# Patient Record
Sex: Female | Born: 1940
Health system: Southern US, Community
[De-identification: ages and names within clinical notes are randomized; demographics above are authoritative.]

---

## 1999-07-25 ENCOUNTER — Encounter: Admission: RE | Admit: 1999-07-25 | Discharge: 1999-07-25 | Payer: Self-pay

## 2009-10-11 ENCOUNTER — Encounter: Admission: RE | Admit: 2009-10-11 | Discharge: 2009-10-11 | Payer: Self-pay | Admitting: Orthopedic Surgery

## 2010-03-07 ENCOUNTER — Ambulatory Visit (HOSPITAL_COMMUNITY)
Admission: RE | Admit: 2010-03-07 | Discharge: 2010-03-08 | Payer: Self-pay | Source: Home / Self Care | Admitting: Orthopedic Surgery

## 2010-04-23 ENCOUNTER — Inpatient Hospital Stay (HOSPITAL_COMMUNITY)
Admission: RE | Admit: 2010-04-23 | Discharge: 2010-04-25 | Payer: Self-pay | Source: Home / Self Care | Attending: Orthopedic Surgery | Admitting: Orthopedic Surgery

## 2010-07-07 LAB — DIFFERENTIAL
Lymphocytes Relative: 30 % (ref 12–46)
Lymphs Abs: 2.5 10*3/uL (ref 0.7–4.0)
Monocytes Relative: 5 % (ref 3–12)
Neutro Abs: 4.6 10*3/uL (ref 1.7–7.7)
Neutrophils Relative %: 55 % (ref 43–77)

## 2010-07-07 LAB — BASIC METABOLIC PANEL
BUN: 10 mg/dL (ref 6–23)
BUN: 11 mg/dL (ref 6–23)
CO2: 26 mEq/L (ref 19–32)
CO2: 27 mEq/L (ref 19–32)
CO2: 28 mEq/L (ref 19–32)
Calcium: 10.9 mg/dL — ABNORMAL HIGH (ref 8.4–10.5)
Calcium: 9 mg/dL (ref 8.4–10.5)
Calcium: 9.2 mg/dL (ref 8.4–10.5)
Chloride: 106 mEq/L (ref 96–112)
Creatinine, Ser: 0.73 mg/dL (ref 0.4–1.2)
Creatinine, Ser: 0.76 mg/dL (ref 0.4–1.2)
GFR calc Af Amer: >60 mL/min (ref >60)
GFR calc Af Amer: >60 mL/min (ref >60)
GFR calc non Af Amer: >60 mL/min (ref >60)
Glucose, Bld: 107 mg/dL — ABNORMAL HIGH (ref 70–99)
Glucose, Bld: 144 mg/dL — ABNORMAL HIGH (ref 70–99)
Glucose, Bld: 88 mg/dL (ref 70–99)
Potassium: 4.5 mEq/L (ref 3.5–5.1)
Sodium: 137 mEq/L (ref 135–145)
Sodium: 141 mEq/L (ref 135–145)

## 2010-07-07 LAB — URINALYSIS, ROUTINE W REFLEX MICROSCOPIC
Bilirubin Urine: NEGATIVE
Glucose, UA: NEGATIVE mg/dL
Hgb urine dipstick: NEGATIVE
Ketones, ur: NEGATIVE mg/dL
Nitrite: NEGATIVE
Protein, ur: NEGATIVE mg/dL
Specific Gravity, Urine: 1.009 (ref 1.005–1.030)
Urobilinogen, UA: 0.2 mg/dL (ref 0.0–1.0)
pH: 7 (ref 5.0–8.0)

## 2010-07-07 LAB — CBC
HCT: 43.5 % (ref 36.0–46.0)
Hemoglobin: 14.2 g/dL (ref 12.0–15.0)
Hemoglobin: 9.4 g/dL — ABNORMAL LOW (ref 12.0–15.0)
MCH: 29 pg (ref 26.0–34.0)
MCH: 29.2 pg (ref 26.0–34.0)
MCH: 29.6 pg (ref 26.0–34.0)
MCHC: 31.3 g/dL (ref 30.0–36.0)
MCHC: 32.1 g/dL (ref 30.0–36.0)
MCHC: 32.6 g/dL (ref 30.0–36.0)
MCV: 90.8 fL (ref 78.0–100.0)
Platelets: 211 10*3/uL (ref 150–400)
Platelets: 257 10*3/uL (ref 150–400)
RBC: 2.97 MIL/uL — ABNORMAL LOW (ref 3.87–5.11)
RBC: 4.79 MIL/uL (ref 3.87–5.11)
RDW: 13.9 % (ref 11.5–15.5)
RDW: 14 % (ref 11.5–15.5)
WBC: 8.4 10*3/uL (ref 4.0–10.5)

## 2010-07-07 LAB — ABO/RH: ABO/RH(D): B POS

## 2010-07-07 LAB — SURGICAL PCR SCREEN
MRSA, PCR: NEGATIVE
Staphylococcus aureus: NEGATIVE

## 2010-07-07 LAB — PROTIME-INR
INR: 1 (ref 0.00–1.49)
Prothrombin Time: 13.4 seconds (ref 11.6–15.2)

## 2010-07-07 LAB — TYPE AND SCREEN: ABO/RH(D): B POS

## 2010-07-07 LAB — APTT: aPTT: 33 seconds (ref 24–37)

## 2010-07-09 LAB — URINALYSIS, ROUTINE W REFLEX MICROSCOPIC
Ketones, ur: NEGATIVE mg/dL
Nitrite: NEGATIVE
Protein, ur: NEGATIVE mg/dL

## 2010-07-09 LAB — CBC
HCT: 35 % — ABNORMAL LOW (ref 36.0–46.0)
HCT: 40.1 % (ref 36.0–46.0)
Hemoglobin: 12 g/dL (ref 12.0–15.0)
Hemoglobin: 13.8 g/dL (ref 12.0–15.0)
MCH: 30.1 pg (ref 26.0–34.0)
MCHC: 34.5 g/dL (ref 30.0–36.0)
MCV: 87.8 fL (ref 78.0–100.0)
RBC: 3.99 MIL/uL (ref 3.87–5.11)
RDW: 14 % (ref 11.5–15.5)
WBC: 9.3 10*3/uL (ref 4.0–10.5)

## 2010-07-09 LAB — BASIC METABOLIC PANEL
BUN: 8 mg/dL (ref 6–23)
CO2: 25 mEq/L (ref 19–32)
Chloride: 107 mEq/L (ref 96–112)
Glucose, Bld: 98 mg/dL (ref 70–99)
Potassium: 4.3 mEq/L (ref 3.5–5.1)
Potassium: 4.9 mEq/L (ref 3.5–5.1)
Sodium: 139 mEq/L (ref 135–145)
Sodium: 141 mEq/L (ref 135–145)

## 2010-07-09 LAB — DIFFERENTIAL
Basophils Relative: 1 % (ref 0–1)
Eosinophils Absolute: 0.5 10*3/uL (ref 0.0–0.7)
Monocytes Absolute: 0.6 10*3/uL (ref 0.1–1.0)
Monocytes Relative: 7 % (ref 3–12)
Neutro Abs: 4.9 10*3/uL (ref 1.7–7.7)

## 2010-07-09 LAB — APTT: aPTT: 34 seconds (ref 24–37)

## 2010-07-27 IMAGING — CT CT EXTREM LOW W/O CM*R*
2 of 3 series · 12 of 36 positions shown, 19 images · non-contrast
Comparison: None.

CLINICAL DATA: Right leg pain.  Fractured femur on 02/08/2009.
Open reduction and internal fixation.

CT OF THE RIGHT KNEE WITHOUT CONTRAST
TECHNIQUE: Multidetector CT imaging of the right knee was
performed according to the standard protocol without intravenous
contrast. Multiplanar CT image reconstructions were also generated.

[Series 3: pelvis standard · axial · 0.35mm/px · z∈[-234,-74]mm · 11 of 74 slices shown, 17 images]
[im 5/74  soft-tissue]
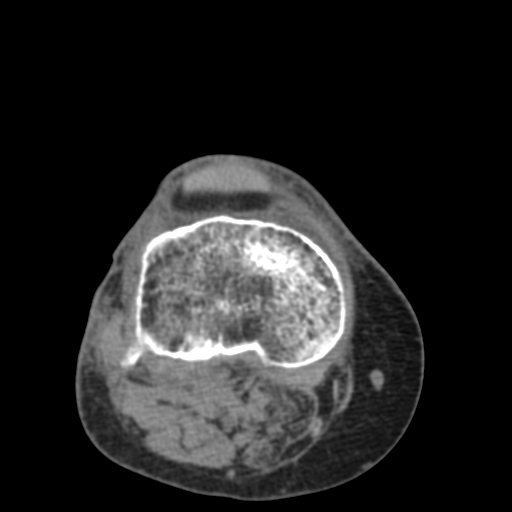
[im 5/74  bone]
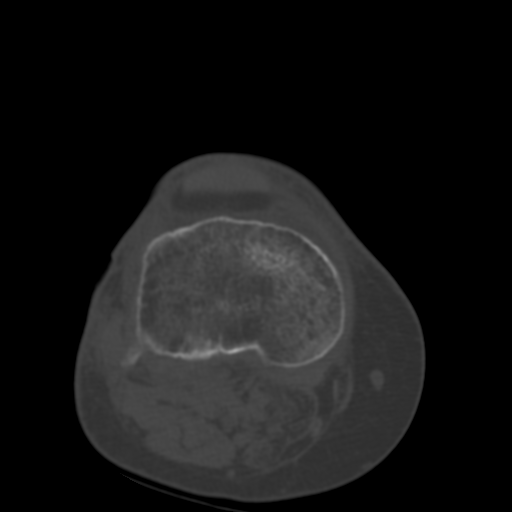
[im 9/74  soft-tissue]
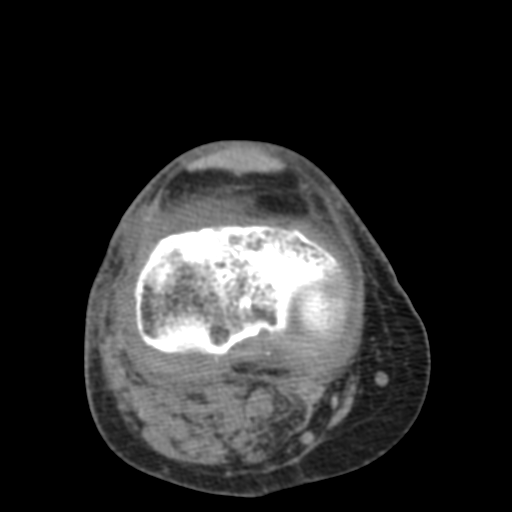
[im 13/74  soft-tissue]
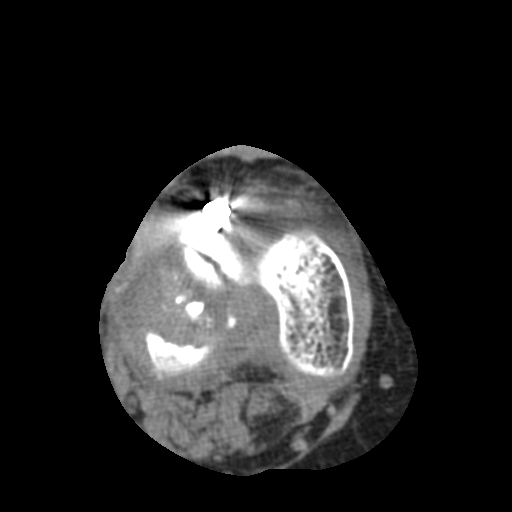
[im 18/74  soft-tissue]
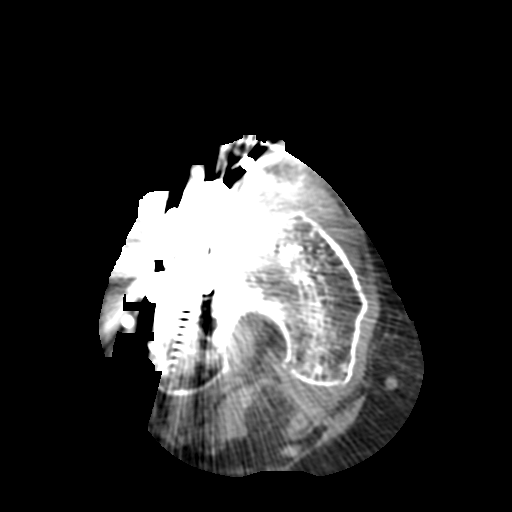
[im 26/74  soft-tissue]
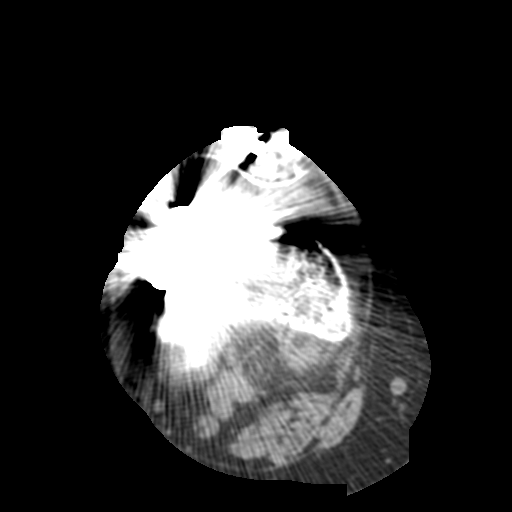
[im 39/74  soft-tissue]
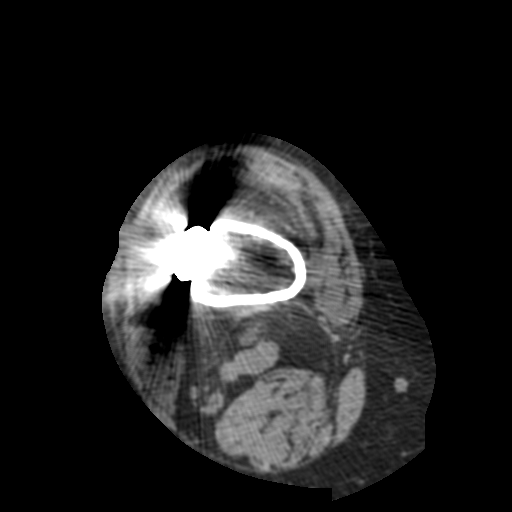
[im 48/74  soft-tissue]
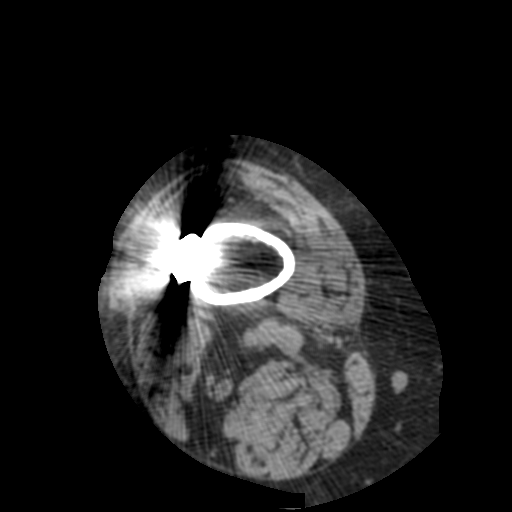
[im 52/74  soft-tissue]
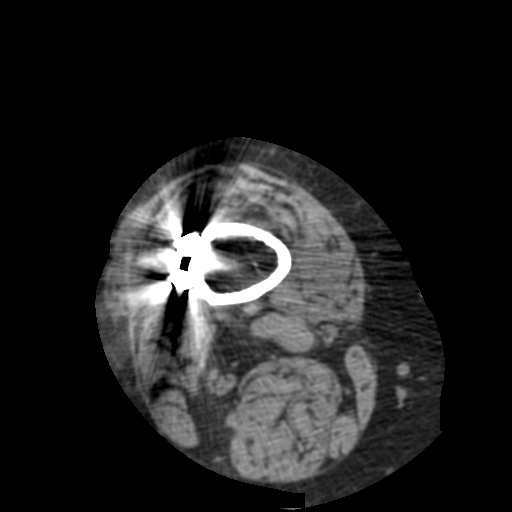
[im 52/74  lung]
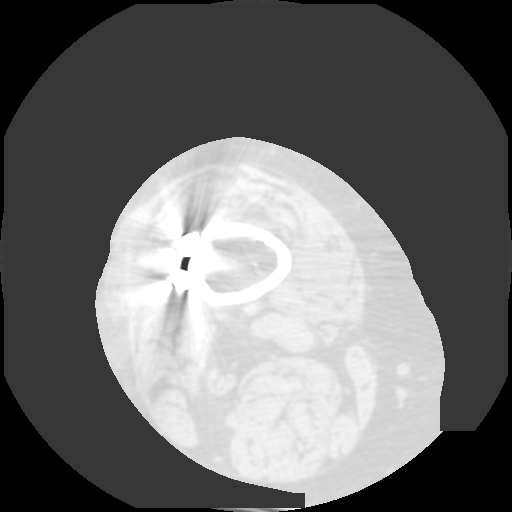
[im 56/74  soft-tissue]
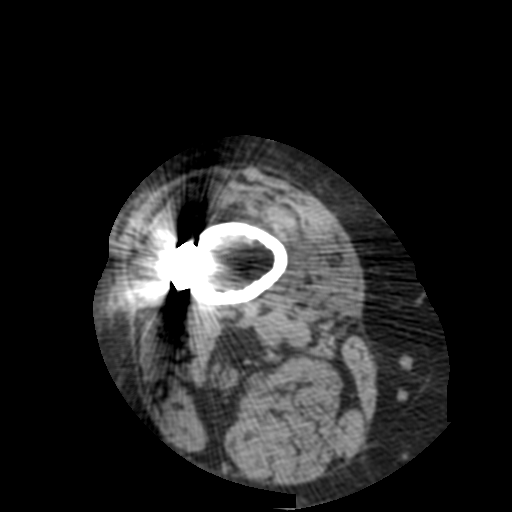
[im 56/74  lung]
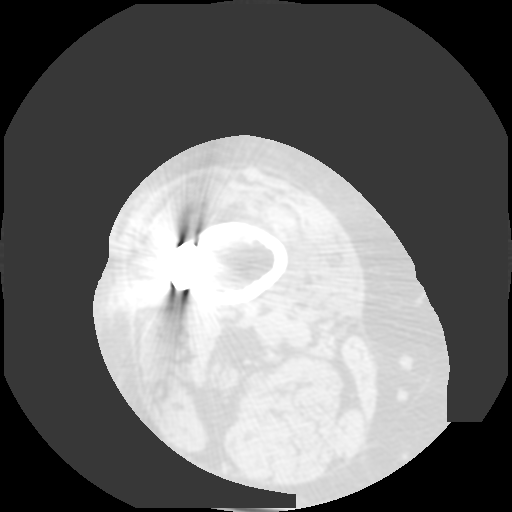
[im 56/74  bone]
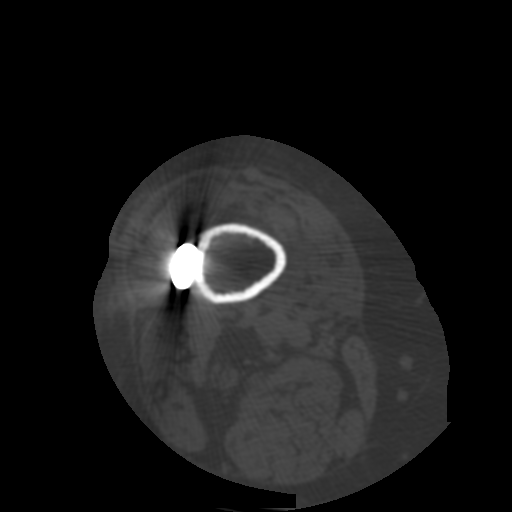
[im 65/74  soft-tissue]
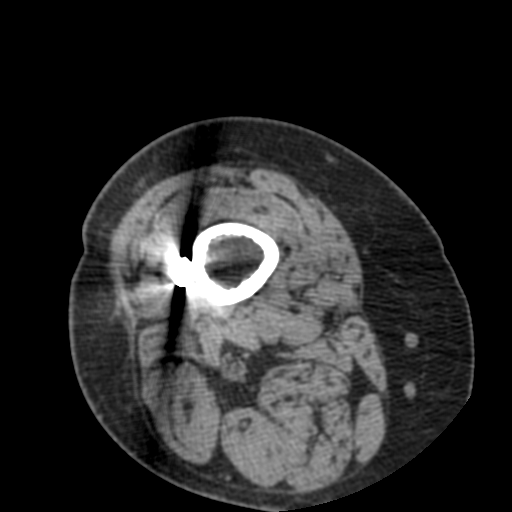
[im 65/74  lung]
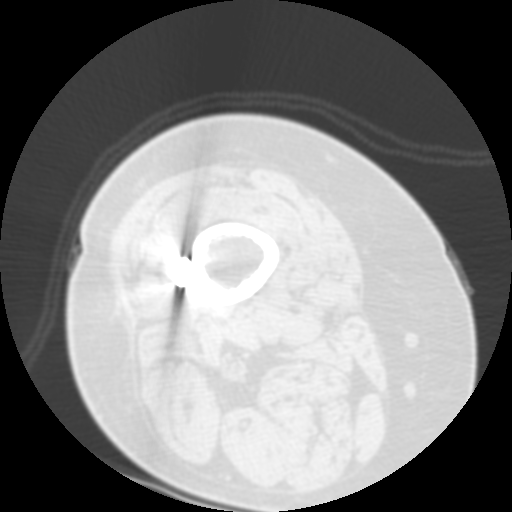
[im 69/74  soft-tissue]
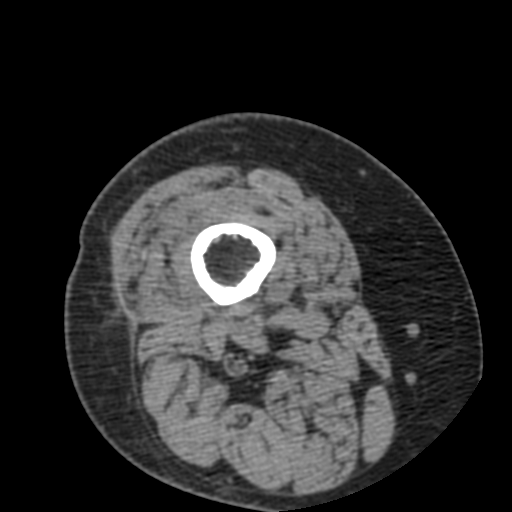
[im 69/74  lung]
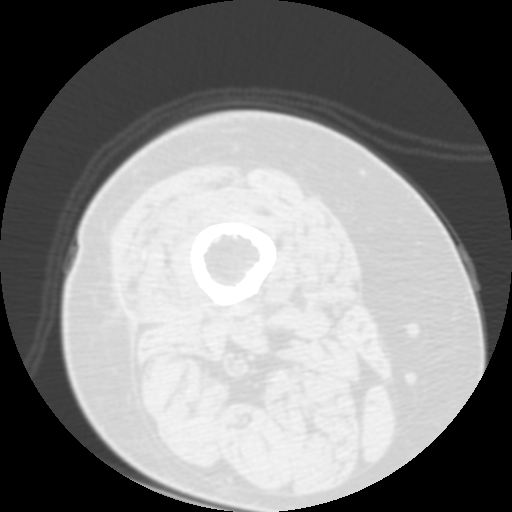

[Series 401: sagittal · sagittal · 0.37mm/px · 1 of 54 slices shown, 2 images]
[im 18/54  soft-tissue]
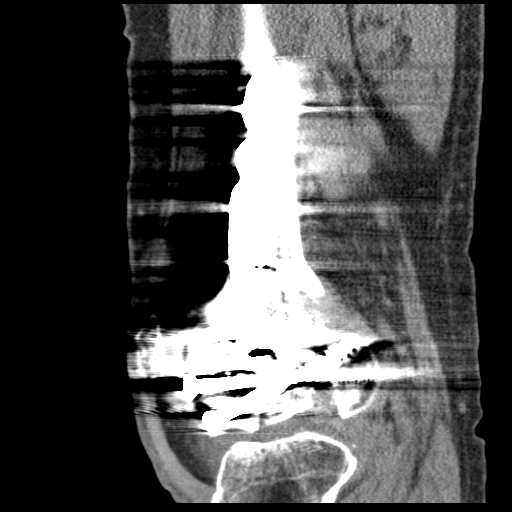
[im 18/54  bone]
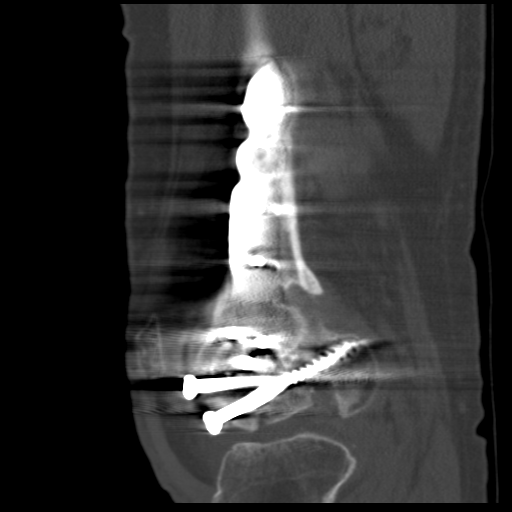

[12 of 36 positions shown; findings below may reference images not displayed]

FINDINGS: There is a comminuted nonunion fracture of the lateral
femoral condyle.  There is fragmentation and impaction of the
central fragments of the lateral femoral condyle.

The medial femoral condyle and proximal tibia are intact.  There is
degenerative narrowing of the medial compartment.

There are no discrete findings of loosening of the screws or side
plate.
IMPRESSION: 1.  Nonunion comminuted fractures of the lateral femoral condyle.
2.  Fragments of the central portion of the lateral femoral condyle
are impacted and fragmented.

## 2015-05-08 DIAGNOSIS — L723 Sebaceous cyst: Secondary | ICD-10-CM | POA: Diagnosis not present

## 2015-05-08 DIAGNOSIS — Z6827 Body mass index (BMI) 27.0-27.9, adult: Secondary | ICD-10-CM | POA: Diagnosis not present

## 2015-05-09 DIAGNOSIS — L02212 Cutaneous abscess of back [any part, except buttock]: Secondary | ICD-10-CM | POA: Diagnosis not present

## 2015-05-09 DIAGNOSIS — L723 Sebaceous cyst: Secondary | ICD-10-CM | POA: Diagnosis not present

## 2015-05-09 DIAGNOSIS — Z6826 Body mass index (BMI) 26.0-26.9, adult: Secondary | ICD-10-CM | POA: Diagnosis not present

## 2015-10-24 DIAGNOSIS — Z6826 Body mass index (BMI) 26.0-26.9, adult: Secondary | ICD-10-CM | POA: Diagnosis not present

## 2015-10-24 DIAGNOSIS — R7303 Prediabetes: Secondary | ICD-10-CM | POA: Diagnosis not present

## 2015-10-24 DIAGNOSIS — E039 Hypothyroidism, unspecified: Secondary | ICD-10-CM | POA: Diagnosis not present

## 2015-10-24 DIAGNOSIS — Z139 Encounter for screening, unspecified: Secondary | ICD-10-CM | POA: Diagnosis not present

## 2015-10-24 DIAGNOSIS — E782 Mixed hyperlipidemia: Secondary | ICD-10-CM | POA: Diagnosis not present

## 2015-10-24 DIAGNOSIS — E663 Overweight: Secondary | ICD-10-CM | POA: Diagnosis not present

## 2016-04-23 DIAGNOSIS — E039 Hypothyroidism, unspecified: Secondary | ICD-10-CM | POA: Diagnosis not present

## 2016-04-23 DIAGNOSIS — E782 Mixed hyperlipidemia: Secondary | ICD-10-CM | POA: Diagnosis not present

## 2016-04-23 DIAGNOSIS — Z6826 Body mass index (BMI) 26.0-26.9, adult: Secondary | ICD-10-CM | POA: Diagnosis not present

## 2016-06-12 DIAGNOSIS — Z6827 Body mass index (BMI) 27.0-27.9, adult: Secondary | ICD-10-CM | POA: Diagnosis not present

## 2016-06-12 DIAGNOSIS — Z0000 Encounter for general adult medical examination without abnormal findings: Secondary | ICD-10-CM | POA: Diagnosis not present

## 2016-06-12 DIAGNOSIS — E663 Overweight: Secondary | ICD-10-CM | POA: Diagnosis not present

## 2016-12-08 DIAGNOSIS — M8589 Other specified disorders of bone density and structure, multiple sites: Secondary | ICD-10-CM | POA: Diagnosis not present

## 2016-12-08 DIAGNOSIS — E782 Mixed hyperlipidemia: Secondary | ICD-10-CM | POA: Diagnosis not present

## 2016-12-08 DIAGNOSIS — E559 Vitamin D deficiency, unspecified: Secondary | ICD-10-CM | POA: Diagnosis not present

## 2016-12-08 DIAGNOSIS — Z6827 Body mass index (BMI) 27.0-27.9, adult: Secondary | ICD-10-CM | POA: Diagnosis not present

## 2016-12-08 DIAGNOSIS — E8881 Metabolic syndrome: Secondary | ICD-10-CM | POA: Diagnosis not present

## 2016-12-08 DIAGNOSIS — Z79899 Other long term (current) drug therapy: Secondary | ICD-10-CM | POA: Diagnosis not present

## 2016-12-08 DIAGNOSIS — K219 Gastro-esophageal reflux disease without esophagitis: Secondary | ICD-10-CM | POA: Diagnosis not present

## 2016-12-08 DIAGNOSIS — E039 Hypothyroidism, unspecified: Secondary | ICD-10-CM | POA: Diagnosis not present

## 2016-12-08 DIAGNOSIS — Z1211 Encounter for screening for malignant neoplasm of colon: Secondary | ICD-10-CM | POA: Diagnosis not present

## 2017-03-10 DIAGNOSIS — E782 Mixed hyperlipidemia: Secondary | ICD-10-CM | POA: Diagnosis not present

## 2017-03-10 DIAGNOSIS — K219 Gastro-esophageal reflux disease without esophagitis: Secondary | ICD-10-CM | POA: Diagnosis not present

## 2017-03-10 DIAGNOSIS — E034 Atrophy of thyroid (acquired): Secondary | ICD-10-CM | POA: Diagnosis not present

## 2017-05-20 DIAGNOSIS — E039 Hypothyroidism, unspecified: Secondary | ICD-10-CM | POA: Insufficient documentation

## 2017-05-20 DIAGNOSIS — E785 Hyperlipidemia, unspecified: Secondary | ICD-10-CM | POA: Insufficient documentation

## 2017-05-20 DIAGNOSIS — Z0000 Encounter for general adult medical examination without abnormal findings: Secondary | ICD-10-CM | POA: Insufficient documentation

## 2018-03-04 DIAGNOSIS — I10 Essential (primary) hypertension: Secondary | ICD-10-CM | POA: Insufficient documentation

## 2018-03-04 DIAGNOSIS — E669 Obesity, unspecified: Secondary | ICD-10-CM | POA: Insufficient documentation

## 2018-11-30 ENCOUNTER — Other Ambulatory Visit: Payer: Self-pay | Admitting: Sports Medicine

## 2018-11-30 ENCOUNTER — Encounter: Payer: Self-pay | Admitting: Sports Medicine

## 2018-11-30 ENCOUNTER — Ambulatory Visit (INDEPENDENT_AMBULATORY_CARE_PROVIDER_SITE_OTHER): Payer: Medicare Other | Admitting: Sports Medicine

## 2018-11-30 ENCOUNTER — Other Ambulatory Visit: Payer: Self-pay

## 2018-11-30 VITALS — Temp 98.7°F | Resp 16

## 2018-11-30 DIAGNOSIS — M79671 Pain in right foot: Secondary | ICD-10-CM

## 2018-11-30 DIAGNOSIS — M2042 Other hammer toe(s) (acquired), left foot: Secondary | ICD-10-CM

## 2018-11-30 DIAGNOSIS — M2041 Other hammer toe(s) (acquired), right foot: Secondary | ICD-10-CM

## 2018-11-30 DIAGNOSIS — M792 Neuralgia and neuritis, unspecified: Secondary | ICD-10-CM | POA: Diagnosis not present

## 2018-11-30 DIAGNOSIS — L84 Corns and callosities: Secondary | ICD-10-CM | POA: Diagnosis not present

## 2018-11-30 DIAGNOSIS — M79672 Pain in left foot: Secondary | ICD-10-CM

## 2018-11-30 MED ORDER — GABAPENTIN 300 MG PO CAPS: 300.0000 mg | ORAL_CAPSULE | Freq: Every day | ORAL | 3 refills | Status: AC

## 2018-11-30 NOTE — Progress Notes (Signed)
Subjective: Theresa Perkins is a 78 y.o. female patient who presents to office for evaluation of Left>Right foot pain secondary to callus skin at toes. Patient complains of pain at the lesion present in between the toes 9/10 shooting pain with burning across the tops of both feet that has been getting worse of the years. Patient has tried corn pads and tape with no relief in symptoms. Patient denies any other pedal complaints.   Review of Systems  All other systems reviewed and are negative.    There are no active problems to display for this patient.   No current outpatient medications on file prior to visit.   No current facility-administered medications on file prior to visit.     Allergies  Allergen Reactions  . Tape Rash    Objective:  General: Alert and oriented x3 in no acute distress  Dermatology: Keratotic lesion present lateral 4th toes L>R and medial 5th toes with at interspace with skin lines transversing the lesions, pain is present with direct pressure to the lesion with a central nucleated core noted, no webspace macerations, no ecchymosis bilateral, all nails x 10 are well manicured.  Vascular: Dorsalis Pedis and Posterior Tibial pedal pulses 2/4, Capillary Fill Time 3 seconds, + pedal hair growth bilateral, no edema bilateral lower extremities, Temperature gradient within normal limits.  Neurology: Gross sensation intact via light touch bilateral.++Buring pain to both feet, diffusely.   Musculoskeletal: Mild tenderness with palpation at the keratotic lesion site on Left>Right, Muscular strength 5/5 in all groups without pain or limitation on range of motion. +Hammertoe boney deformity noted.  Assessment and Plan: Problem List Items Addressed This Visit    None    Visit Diagnoses    Neuritis    -  Primary   Corns and callosities       Hammer toes of both feet       Foot pain, bilateral         -Complete examination performed -Discussed treatment  options -Parred keratoic lesion using a chisel blade x 2 at no charge -Dispensed toe spacers -Advised good supportive shoes and inserts -Rx Gabapentin for burning pain -Patient to return to office in 1 month for med check or sooner if condition worsens. Advised patient if continues to be painful may benefit from hammertoe surgery. If she decides that she wants surgery will need to get Xrays.   Landis Martins, DPM

## 2018-12-15 ENCOUNTER — Ambulatory Visit: Payer: Medicare Other | Admitting: Sports Medicine

## 2018-12-15 ENCOUNTER — Encounter: Payer: Self-pay | Admitting: Sports Medicine

## 2018-12-15 ENCOUNTER — Other Ambulatory Visit: Payer: Self-pay

## 2018-12-15 VITALS — Temp 99.0°F | Resp 16

## 2018-12-15 DIAGNOSIS — M2042 Other hammer toe(s) (acquired), left foot: Secondary | ICD-10-CM

## 2018-12-15 DIAGNOSIS — M2041 Other hammer toe(s) (acquired), right foot: Secondary | ICD-10-CM

## 2018-12-15 DIAGNOSIS — M79671 Pain in right foot: Secondary | ICD-10-CM

## 2018-12-15 DIAGNOSIS — L84 Corns and callosities: Secondary | ICD-10-CM | POA: Diagnosis not present

## 2018-12-15 DIAGNOSIS — M792 Neuralgia and neuritis, unspecified: Secondary | ICD-10-CM | POA: Diagnosis not present

## 2018-12-15 DIAGNOSIS — M79672 Pain in left foot: Secondary | ICD-10-CM

## 2018-12-15 NOTE — Progress Notes (Signed)
Subjective: Theresa Perkins is a 78 y.o. female patient who returns to office for follow-up evaluation of bilateral fifth toe pain left greater than right.  Patient reports that the gabapentin has helped the burning pain does not burn is much but reports that she still has pain at the baby toes especially when in shoes reports that the position helps a little bit but pain on average is 5 out of 10.  Patient denies any other changes or any other constitutional symptoms at this time.   There are no active problems to display for this patient.   Current Outpatient Medications on File Prior to Visit  Medication Sig Dispense Refill  . gabapentin (NEURONTIN) 300 MG capsule Take 1 capsule (300 mg total) by mouth at bedtime. 90 capsule 3  . hydrochlorothiazide (HYDRODIURIL) 25 MG tablet     . levothyroxine (SYNTHROID) 50 MCG tablet     . lisinopril (ZESTRIL) 5 MG tablet     . pravastatin (PRAVACHOL) 80 MG tablet      No current facility-administered medications on file prior to visit.     Allergies  Allergen Reactions  . Tape Rash    Objective:  General: Alert and oriented x3 in no acute distress  Dermatology: Keratotic lesion present lateral 4th toes L>R and medial 5th toes with at interspace with skin lines transversing the lesions, pain is present with direct pressure to the lesion with a central nucleated core noted like before, no webspace macerations, no ecchymosis bilateral, all nails x 10 are well manicured.  Vascular: Dorsalis Pedis and Posterior Tibial pedal pulses 2/4, Capillary Fill Time 3 seconds, + pedal hair growth bilateral, no edema bilateral lower extremities, Temperature gradient within normal limits.  Neurology: Gross sensation intact via light touch bilateral.++Buring pain to both feet, diffusely.   Musculoskeletal: Mild tenderness with palpation at the keratotic lesion site on Left>Right, Muscular strength 5/5 in all groups without pain or limitation on range of motion.  +Hammertoe boney deformity noted.  Assessment and Plan: Problem List Items Addressed This Visit    None    Visit Diagnoses    Neuritis    -  Primary   Corns and callosities       Hammer toes of both feet       Foot pain, bilateral         -Complete examination performed -Discussed treatment options -Patient declined x-rays and states that she cannot take time off from work for surgery at this time -Parred keratoic lesion using a chisel blade x 2 at no charge -Dispensed a new set of toe spacers -Advised good supportive shoes and inserts -Continue with gabapentin for burning pain and advised patient that she has refills available if she runs out -Patient to return to office as needed or sooner if any problems or issues arise.Landis Martins, DPM

## 2019-04-12 ENCOUNTER — Ambulatory Visit: Payer: Medicare Other | Admitting: Sports Medicine

## 2019-09-21 DIAGNOSIS — N3281 Overactive bladder: Secondary | ICD-10-CM | POA: Insufficient documentation

## 2020-01-05 ENCOUNTER — Encounter: Payer: Self-pay | Admitting: Sports Medicine

## 2020-01-05 ENCOUNTER — Other Ambulatory Visit: Payer: Self-pay

## 2020-01-05 ENCOUNTER — Ambulatory Visit: Payer: Medicare Other | Admitting: Sports Medicine

## 2020-01-05 ENCOUNTER — Ambulatory Visit (INDEPENDENT_AMBULATORY_CARE_PROVIDER_SITE_OTHER): Payer: Medicare Other

## 2020-01-05 DIAGNOSIS — M79671 Pain in right foot: Secondary | ICD-10-CM | POA: Diagnosis not present

## 2020-01-05 DIAGNOSIS — M792 Neuralgia and neuritis, unspecified: Secondary | ICD-10-CM

## 2020-01-05 DIAGNOSIS — M2041 Other hammer toe(s) (acquired), right foot: Secondary | ICD-10-CM

## 2020-01-05 DIAGNOSIS — L84 Corns and callosities: Secondary | ICD-10-CM

## 2020-01-05 DIAGNOSIS — M2042 Other hammer toe(s) (acquired), left foot: Secondary | ICD-10-CM

## 2020-01-05 DIAGNOSIS — M79672 Pain in left foot: Secondary | ICD-10-CM

## 2020-01-05 NOTE — Progress Notes (Signed)
Subjective: Theresa Perkins is a 79 y.o. female patient who returns to office for follow-up evaluation of bilateral 4th and fifth toe pain toe right greater than left with burning pain to the toes. Reports that she has stopped gabapentin and been trying to wear wider shoes, file dead skin but still pain and wants to discuss surgery. No other issues noted.    There are no problems to display for this patient.   Current Outpatient Medications on File Prior to Visit  Medication Sig Dispense Refill  . gabapentin (NEURONTIN) 300 MG capsule Take 1 capsule (300 mg total) by mouth at bedtime. 90 capsule 3  . hydrochlorothiazide (HYDRODIURIL) 25 MG tablet     . levothyroxine (SYNTHROID) 50 MCG tablet     . lisinopril (ZESTRIL) 5 MG tablet     . pravastatin (PRAVACHOL) 80 MG tablet      No current facility-administered medications on file prior to visit.    Allergies  Allergen Reactions  . Tape Rash   No past surgical history on file.   Social History   Socioeconomic History  . Marital status: Married    Spouse name: Not on file  . Number of children: Not on file  . Years of education: Not on file  . Highest education level: Not on file  Occupational History  . Not on file  Tobacco Use  . Smoking status: Never Smoker  . Smokeless tobacco: Never Used  Substance and Sexual Activity  . Alcohol use: Not on file  . Drug use: Not on file  . Sexual activity: Not on file  Other Topics Concern  . Not on file  Social History Narrative  . Not on file   Social Determinants of Health   Financial Resource Strain:   . Difficulty of Paying Living Expenses: Not on file  Food Insecurity:   . Worried About Programme researcher, broadcasting/film/video in the Last Year: Not on file  . Ran Out of Food in the Last Year: Not on file  Transportation Needs:   . Lack of Transportation (Medical): Not on file  . Lack of Transportation (Non-Medical): Not on file  Physical Activity:   . Days of Exercise per Week: Not on file   . Minutes of Exercise per Session: Not on file  Stress:   . Feeling of Stress : Not on file  Social Connections:   . Frequency of Communication with Friends and Family: Not on file  . Frequency of Social Gatherings with Friends and Family: Not on file  . Attends Religious Services: Not on file  . Active Member of Clubs or Organizations: Not on file  . Attends Banker Meetings: Not on file  . Marital Status: Not on file   No family history on file.  Objective:  General: Alert and oriented x3 in no acute distress  Dermatology: Keratotic lesion present lateral 4th toes right greater than left and medial 5th toes with at interspace with skin lines transversing the lesions, pain is present with direct pressure to the lesion with a central nucleated core noted like before and is more painful today on the right, no webspace macerations, no ecchymosis bilateral, all nails x 10 are well manicured.  Vascular: Dorsalis Pedis and Posterior Tibial pedal pulses 2/4, Capillary Fill Time 3 seconds, + pedal hair growth bilateral, no edema bilateral lower extremities, Temperature gradient within normal limits.  Neurology: Gross sensation intact via light touch bilateral.++Buring pain to both feet, diffusely.   Musculoskeletal: Minimal  tenderness with palpation at the keratotic lesion sites at 4th webspaces. Muscular strength 5/5 in all groups without pain or limitation on range of motion. +Hammertoe boney deformity noted.  X-rays right foot consistent with hammertoe deformity no other acute osseous findings.  Assessment and Plan: Problem List Items Addressed This Visit    None    Visit Diagnoses    Neuritis    -  Primary   Relevant Orders   DG Foot Complete Right   DG Foot Complete Left   Hammer toes of both feet       Corns and callosities       Foot pain, bilateral       R>L      -Complete examination performed -Discussed treatment options -Xrays reviewed  -Patient opt for  surgical management. Consent obtained for right 4-5 hammertoe repair with possible K wire with excision of corn in between toes. Pre and Post op course explained. Risks, benefits, alternatives explained. No guarantees given or implied. Surgical booking slip submitted and provided patient with Surgical packet and info for [facility].  -To dispense post op shoe at [facility] -Patient to return to office after surgery or sooner if any problems or issues arise.Asencion Islam, DPM

## 2020-01-17 ENCOUNTER — Telehealth: Payer: Self-pay

## 2020-01-17 NOTE — Telephone Encounter (Addendum)
DOS 01/29/2020  EXC BENIGN LESION RT - 11421 HAMMERTOE REPAIR 4,5 RT - 28285  [employer] MEDICARE EFFECTIVE DATE - 04/28/2019  PLAN DEDUCTIBLE - $0.00 OUT OF POCKET - $3600.00 W/ $3540.00 REMAINING  CO-INSURANCE 0% / Day OUTPATIENT SURGERY 0% / Day OUTPATIENT HOSPITAL COPAY $295 / Day OUTPATIENT SURGERY $295 / Day OUTPATIENT HOSPITAL  RECEIVED FAX FROM [facility] WITH AUTH# [id number] FOR CPT [phone] & [phone]  Notification or Prior Authorization is not required for the requested services  This [employer] Medicare Advantage members plan does not currently require a prior authorization for these services. If you have general questions about the prior authorization requirements, please call us at [phone] or visit [email or web address] > Clinician Resources > Advance and Admission Notification Requirements. The number above acknowledges your notification. Please write this number down for future reference. Notification is not a guarantee of coverage or payment.  Decision ID #:[id number]

## 2020-01-25 DIAGNOSIS — M79676 Pain in unspecified toe(s): Secondary | ICD-10-CM

## 2020-01-28 ENCOUNTER — Other Ambulatory Visit: Payer: Self-pay | Admitting: Sports Medicine

## 2020-01-28 NOTE — Progress Notes (Signed)
Post op meds entered -Dr. S 

## 2020-01-29 DIAGNOSIS — L84 Corns and callosities: Secondary | ICD-10-CM | POA: Diagnosis not present

## 2020-01-29 DIAGNOSIS — M2041 Other hammer toe(s) (acquired), right foot: Secondary | ICD-10-CM | POA: Diagnosis not present

## 2020-01-29 MED ORDER — HYDROCODONE-ACETAMINOPHEN 5-325 MG PO TABS: 1.0000 | ORAL_TABLET | Freq: Four times a day (QID) | ORAL | 0 refills | Status: DC | PRN

## 2020-01-29 MED ORDER — IBUPROFEN 800 MG PO TABS: 800.0000 mg | ORAL_TABLET | Freq: Three times a day (TID) | ORAL | 0 refills | Status: DC | PRN

## 2020-01-29 MED ORDER — DOCUSATE SODIUM 100 MG PO CAPS: 100.0000 mg | ORAL_CAPSULE | Freq: Two times a day (BID) | ORAL | 0 refills | Status: DC

## 2020-01-29 MED ORDER — PROMETHAZINE HCL 12.5 MG PO TABS: 12.5000 mg | ORAL_TABLET | Freq: Three times a day (TID) | ORAL | 0 refills | Status: DC | PRN

## 2020-01-30 ENCOUNTER — Other Ambulatory Visit: Payer: Self-pay | Admitting: Sports Medicine

## 2020-01-30 ENCOUNTER — Telehealth: Payer: Self-pay | Admitting: Sports Medicine

## 2020-01-30 DIAGNOSIS — Z9889 Other specified postprocedural states: Secondary | ICD-10-CM

## 2020-01-30 MED ORDER — IBUPROFEN 800 MG PO TABS: 800.0000 mg | ORAL_TABLET | Freq: Three times a day (TID) | ORAL | 0 refills | Status: DC | PRN

## 2020-01-30 MED ORDER — DOCUSATE SODIUM 100 MG PO CAPS: 100.0000 mg | ORAL_CAPSULE | Freq: Two times a day (BID) | ORAL | 0 refills | Status: AC

## 2020-01-30 MED ORDER — HYDROCODONE-ACETAMINOPHEN 5-325 MG PO TABS: 1.0000 | ORAL_TABLET | Freq: Four times a day (QID) | ORAL | 0 refills | Status: AC | PRN

## 2020-01-30 MED ORDER — PROMETHAZINE HCL 12.5 MG PO TABS: 12.5000 mg | ORAL_TABLET | Freq: Three times a day (TID) | ORAL | 0 refills | Status: AC | PRN

## 2020-01-30 NOTE — Progress Notes (Signed)
Resent post op meds to pharmacy  -Dr. Kathie Rhodes

## 2020-01-30 NOTE — Telephone Encounter (Signed)
Post op check performed.  Patient reports that she had an up-and-down night last night was unable to get medications reports that her caregiver went to the pharmacy and the pharmacy said they did not have any prescription from me even though they were sent on yesterday.  I advised patient that I would resend the medication to her pharmacy and her caregiver can check with the pharmacy around lunchtime to pick up medications.  I have advised patient to continue with rest ice elevation and taking Tylenol or aspirin that she has at home for any additional pain relief until she can get the medication that I sent on yesterday.  Patient thanked me for calling. Dr. Marylene Land.

## 2020-02-06 ENCOUNTER — Ambulatory Visit (INDEPENDENT_AMBULATORY_CARE_PROVIDER_SITE_OTHER): Payer: Medicare Other | Admitting: Sports Medicine

## 2020-02-06 ENCOUNTER — Other Ambulatory Visit: Payer: Self-pay

## 2020-02-06 ENCOUNTER — Encounter: Payer: Self-pay | Admitting: Sports Medicine

## 2020-02-06 ENCOUNTER — Other Ambulatory Visit: Payer: Self-pay | Admitting: Sports Medicine

## 2020-02-06 DIAGNOSIS — M2042 Other hammer toe(s) (acquired), left foot: Secondary | ICD-10-CM

## 2020-02-06 DIAGNOSIS — M792 Neuralgia and neuritis, unspecified: Secondary | ICD-10-CM

## 2020-02-06 DIAGNOSIS — Z9889 Other specified postprocedural states: Secondary | ICD-10-CM

## 2020-02-06 DIAGNOSIS — M2041 Other hammer toe(s) (acquired), right foot: Secondary | ICD-10-CM

## 2020-02-06 NOTE — Progress Notes (Signed)
Subjective: Theresa Perkins is a 79 y.o. female patient seen today in office for POV #1 (DOS 01-29-20), S/P right 4-5 hammertoe repair. Patient denies pain at surgical site except a little throbbing caregiver bumped toe, denies calf pain, denies headache, chest pain, shortness of breath, nausea, vomiting, fever, or chills. No other issues noted.   There are no problems to display for this patient.   Current Outpatient Medications on File Prior to Visit  Medication Sig Dispense Refill   docusate sodium (COLACE) 100 MG capsule Take 1 capsule (100 mg total) by mouth 2 (two) times daily. 10 capsule 0   gabapentin (NEURONTIN) 300 MG capsule Take 1 capsule (300 mg total) by mouth at bedtime. 90 capsule 3   hydrochlorothiazide (HYDRODIURIL) 25 MG tablet      HYDROcodone-acetaminophen (NORCO) 5-325 MG tablet Take 1 tablet by mouth every 6 (six) hours as needed for up to 7 days for moderate pain. 28 tablet 0   ibuprofen (ADVIL) 800 MG tablet Take 1 tablet (800 mg total) by mouth every 8 (eight) hours as needed. 30 tablet 0   levothyroxine (SYNTHROID) 50 MCG tablet      lisinopril (ZESTRIL) 5 MG tablet      pravastatin (PRAVACHOL) 80 MG tablet      promethazine (PHENERGAN) 12.5 MG tablet Take 1 tablet (12.5 mg total) by mouth every 8 (eight) hours as needed for nausea or vomiting. 20 tablet 0   No current facility-administered medications on file prior to visit.    Allergies  Allergen Reactions   Tape Rash    Objective: There were no vitals filed for this visit.  General: No acute distress, AAOx3  Right foot: Sutures intact with no gapping or dehiscence at surgical site, mild swelling to right 4-5 toes, no erythema, no warmth, no drainage, no signs of infection noted, Capillary fill time <3 seconds in all digits, gross sensation present via light touch to right foot. No pain or crepitation with range of motion right.  No pain with calf compression.   Assessment and Plan:  Problem List  Items Addressed This Visit    None    Visit Diagnoses    S/P foot surgery, right    -  Primary   Neuritis       Hammer toes of both feet           -Patient seen and evaluated -Xrays unable to be obtained due the machine being down -Applied dry sterile dressing to surgical site right foot secured with ACE wrap and stockinet  -Advised patient to make sure to keep dressings clean, dry, and intact to right surgical site, removing the ACE as needed  -Advised patient to continue with post-op shoe on right foot   -Advised patient to limit activity to necessity  -Advised patient to ice and elevate as necessary  -Will plan for possible suture removal at next office visit. In the meantime, patient to call office if any issues or problems arise.   Asencion Islam, DPM

## 2020-02-13 ENCOUNTER — Other Ambulatory Visit: Payer: Self-pay

## 2020-02-13 ENCOUNTER — Encounter: Payer: Self-pay | Admitting: Sports Medicine

## 2020-02-13 ENCOUNTER — Ambulatory Visit (INDEPENDENT_AMBULATORY_CARE_PROVIDER_SITE_OTHER): Payer: Medicare Other | Admitting: Sports Medicine

## 2020-02-13 DIAGNOSIS — Z9889 Other specified postprocedural states: Secondary | ICD-10-CM

## 2020-02-13 DIAGNOSIS — M2042 Other hammer toe(s) (acquired), left foot: Secondary | ICD-10-CM

## 2020-02-13 DIAGNOSIS — L84 Corns and callosities: Secondary | ICD-10-CM

## 2020-02-13 DIAGNOSIS — M2041 Other hammer toe(s) (acquired), right foot: Secondary | ICD-10-CM

## 2020-02-13 DIAGNOSIS — M79672 Pain in left foot: Secondary | ICD-10-CM

## 2020-02-13 DIAGNOSIS — M79671 Pain in right foot: Secondary | ICD-10-CM

## 2020-02-13 DIAGNOSIS — M792 Neuralgia and neuritis, unspecified: Secondary | ICD-10-CM

## 2020-02-13 NOTE — Progress Notes (Signed)
Subjective: Theresa Perkins is a 79 y.o. female patient seen today in office for POV #2 (DOS 01-29-20), S/P right 4-5 hammertoe repair. Patient denies pain at surgical site except at the fourth toe at the base feeling like a stitch is sticking in her and reports that if they do not come out today she will take them out herself, patient denies chest pain, shortness of breath, nausea, vomiting, fever, or chills. No other issues noted.   Patient Active Problem List   Diagnosis Date Noted  . Overactive bladder 09/21/2019  . Hypertension 03/04/2018  . Obesity (BMI 30-39.9) 03/04/2018  . Encounter for Medicare annual wellness exam 05/20/2017  . Hyperlipidemia 05/20/2017  . Hypothyroidism (acquired) 05/20/2017    Current Outpatient Medications on File Prior to Visit  Medication Sig Dispense Refill  . docusate sodium (COLACE) 100 MG capsule Take 1 capsule (100 mg total) by mouth 2 (two) times daily. 10 capsule 0  . gabapentin (NEURONTIN) 300 MG capsule Take 1 capsule (300 mg total) by mouth at bedtime. 90 capsule 3  . hydrochlorothiazide (HYDRODIURIL) 25 MG tablet     . ibuprofen (ADVIL) 800 MG tablet TAKE 1 TABLET(800 MG) BY MOUTH EVERY 8 HOURS AS NEEDED 30 tablet 0  . levothyroxine (SYNTHROID) 50 MCG tablet     . lisinopril (ZESTRIL) 5 MG tablet     . pravastatin (PRAVACHOL) 80 MG tablet     . promethazine (PHENERGAN) 12.5 MG tablet Take 1 tablet (12.5 mg total) by mouth every 8 (eight) hours as needed for nausea or vomiting. 20 tablet 0   No current facility-administered medications on file prior to visit.    Allergies  Allergen Reactions  . Tape Rash    Objective: There were no vitals filed for this visit.  General: No acute distress, AAOx3  Right foot: Sutures intact with no gapping or dehiscence at surgical site, mild swelling to right 4-5 toes, no erythema, no warmth, no drainage, no signs of infection noted, Capillary fill time <3 seconds in all digits, gross sensation present via  light touch to right foot. No pain or crepitation with range of motion right.  No pain with calf compression.   Assessment and Plan:  Problem List Items Addressed This Visit    None    Visit Diagnoses    S/P foot surgery, right    -  Primary   Neuritis       Hammer toes of both feet       Corns and callosities       Foot pain, bilateral           -Patient seen and evaluated -Sutures removed and applied dry dressing advised patient may remove dressing tomorrow and shower and then redressed with Ace wrap -Advised patient to continue with postop shoe until next week and afterwards may try a bedroom slipper or a soft shoe -Advised patient to limit activity to necessity  -Advised patient to ice and elevate as instructed and advised patient if she fails to do so the toes may continue to swell -Will plan for x-rays and possible transition to normal shoe at next office visit. In the meantime, patient to call office if any issues or problems arise.   Asencion Islam, DPM

## 2020-02-15 ENCOUNTER — Other Ambulatory Visit: Payer: Self-pay | Admitting: Sports Medicine

## 2020-02-15 DIAGNOSIS — Z9889 Other specified postprocedural states: Secondary | ICD-10-CM

## 2020-02-22 ENCOUNTER — Other Ambulatory Visit: Payer: Self-pay | Admitting: Sports Medicine

## 2020-02-22 DIAGNOSIS — Z9889 Other specified postprocedural states: Secondary | ICD-10-CM

## 2020-02-22 NOTE — Telephone Encounter (Signed)
Please advise 

## 2020-02-27 ENCOUNTER — Ambulatory Visit (INDEPENDENT_AMBULATORY_CARE_PROVIDER_SITE_OTHER): Payer: Medicare Other

## 2020-02-27 ENCOUNTER — Encounter: Payer: Self-pay | Admitting: Sports Medicine

## 2020-02-27 ENCOUNTER — Ambulatory Visit: Payer: Medicare Other

## 2020-02-27 ENCOUNTER — Ambulatory Visit (INDEPENDENT_AMBULATORY_CARE_PROVIDER_SITE_OTHER): Payer: Medicare Other | Admitting: Sports Medicine

## 2020-02-27 ENCOUNTER — Other Ambulatory Visit: Payer: Self-pay

## 2020-02-27 ENCOUNTER — Encounter: Payer: Medicare Other | Admitting: Sports Medicine

## 2020-02-27 DIAGNOSIS — M792 Neuralgia and neuritis, unspecified: Secondary | ICD-10-CM

## 2020-02-27 DIAGNOSIS — Z9889 Other specified postprocedural states: Secondary | ICD-10-CM

## 2020-02-27 DIAGNOSIS — M2041 Other hammer toe(s) (acquired), right foot: Secondary | ICD-10-CM

## 2020-02-27 DIAGNOSIS — M2042 Other hammer toe(s) (acquired), left foot: Secondary | ICD-10-CM

## 2020-02-27 NOTE — Progress Notes (Signed)
Subjective: Theresa Perkins is a 79 y.o. female patient seen today in office for POV #3 (DOS 01-29-20), S/P right 4-5 hammertoe repair. Patient denies pain at surgical site except at the fourth toe at the base feeling like a stitch is still there sometimes but toes feel good in normal shoe, patient denies chest pain, shortness of breath, nausea, vomiting, fever, or chills. No other issues noted.   Patient Active Problem List   Diagnosis Date Noted  . Overactive bladder 09/21/2019  . Hypertension 03/04/2018  . Obesity (BMI 30-39.9) 03/04/2018  . Encounter for Medicare annual wellness exam 05/20/2017  . Hyperlipidemia 05/20/2017  . Hypothyroidism (acquired) 05/20/2017    Current Outpatient Medications on File Prior to Visit  Medication Sig Dispense Refill  . docusate sodium (COLACE) 100 MG capsule Take 1 capsule (100 mg total) by mouth 2 (two) times daily. 10 capsule 0  . gabapentin (NEURONTIN) 300 MG capsule Take 1 capsule (300 mg total) by mouth at bedtime. 90 capsule 3  . hydrochlorothiazide (HYDRODIURIL) 25 MG tablet     . ibuprofen (ADVIL) 800 MG tablet TAKE 1 TABLET(800 MG) BY MOUTH EVERY 8 HOURS AS NEEDED 30 tablet 0  . levothyroxine (SYNTHROID) 50 MCG tablet     . lisinopril (ZESTRIL) 5 MG tablet     . pravastatin (PRAVACHOL) 80 MG tablet     . promethazine (PHENERGAN) 12.5 MG tablet Take 1 tablet (12.5 mg total) by mouth every 8 (eight) hours as needed for nausea or vomiting. 20 tablet 0   No current facility-administered medications on file prior to visit.    Allergies  Allergen Reactions  . Tape Rash    Objective: There were no vitals filed for this visit.  General: No acute distress, AAOx3  Right foot: Surgical site healed, mild swelling to right 4-5 toes, no erythema, no warmth, no drainage, no signs of infection noted, Capillary fill time <3 seconds in all digits, gross sensation present via light touch to right foot. No pain or crepitation with range of motion right.   No pain with calf compression.   Xray consistent with post op status  Assessment and Plan:  Problem List Items Addressed This Visit    None    Visit Diagnoses    Neuritis    -  Primary   Relevant Orders   DG Foot Complete Right   Hammer toes of both feet       S/P foot surgery, right           -Patient seen and evaluated -Xrays reviewed -Advised patient to limit activity to tolerance in good supportive shoe -Advised patient to ice and elevate as instructed to help with swelling -May use topical lidocaine for toe pain -Return to work no restritions -Will plan for f/u as needed  Asencion Islam, DPM

## 2020-03-06 DIAGNOSIS — Z9181 History of falling: Secondary | ICD-10-CM | POA: Diagnosis not present

## 2020-03-06 DIAGNOSIS — I10 Essential (primary) hypertension: Secondary | ICD-10-CM | POA: Diagnosis not present

## 2020-03-06 DIAGNOSIS — R7303 Prediabetes: Secondary | ICD-10-CM | POA: Diagnosis not present

## 2020-03-06 DIAGNOSIS — E782 Mixed hyperlipidemia: Secondary | ICD-10-CM | POA: Diagnosis not present

## 2020-03-06 DIAGNOSIS — Z139 Encounter for screening, unspecified: Secondary | ICD-10-CM | POA: Diagnosis not present

## 2020-03-06 DIAGNOSIS — E039 Hypothyroidism, unspecified: Secondary | ICD-10-CM | POA: Diagnosis not present

## 2020-09-04 DIAGNOSIS — L02219 Cutaneous abscess of trunk, unspecified: Secondary | ICD-10-CM | POA: Diagnosis not present

## 2020-09-04 DIAGNOSIS — R7303 Prediabetes: Secondary | ICD-10-CM | POA: Diagnosis not present

## 2020-09-04 DIAGNOSIS — E782 Mixed hyperlipidemia: Secondary | ICD-10-CM | POA: Diagnosis not present

## 2020-09-04 DIAGNOSIS — E039 Hypothyroidism, unspecified: Secondary | ICD-10-CM | POA: Diagnosis not present

## 2020-09-04 DIAGNOSIS — I10 Essential (primary) hypertension: Secondary | ICD-10-CM | POA: Diagnosis not present

## 2021-03-11 DIAGNOSIS — Z139 Encounter for screening, unspecified: Secondary | ICD-10-CM | POA: Diagnosis not present

## 2021-03-11 DIAGNOSIS — E782 Mixed hyperlipidemia: Secondary | ICD-10-CM | POA: Diagnosis not present

## 2021-03-11 DIAGNOSIS — Z9181 History of falling: Secondary | ICD-10-CM | POA: Diagnosis not present

## 2021-03-11 DIAGNOSIS — I10 Essential (primary) hypertension: Secondary | ICD-10-CM | POA: Diagnosis not present

## 2021-03-11 DIAGNOSIS — M79672 Pain in left foot: Secondary | ICD-10-CM | POA: Diagnosis not present

## 2021-03-11 DIAGNOSIS — E039 Hypothyroidism, unspecified: Secondary | ICD-10-CM | POA: Diagnosis not present

## 2021-03-11 DIAGNOSIS — R7303 Prediabetes: Secondary | ICD-10-CM | POA: Diagnosis not present

## 2021-03-11 DIAGNOSIS — G8929 Other chronic pain: Secondary | ICD-10-CM | POA: Diagnosis not present

## 2021-06-02 DIAGNOSIS — Z9181 History of falling: Secondary | ICD-10-CM | POA: Diagnosis not present

## 2021-06-02 DIAGNOSIS — E785 Hyperlipidemia, unspecified: Secondary | ICD-10-CM | POA: Diagnosis not present

## 2021-06-02 DIAGNOSIS — Z0000 Encounter for general adult medical examination without abnormal findings: Secondary | ICD-10-CM | POA: Diagnosis not present

## 2021-09-08 DIAGNOSIS — R7303 Prediabetes: Secondary | ICD-10-CM | POA: Diagnosis not present

## 2021-09-08 DIAGNOSIS — M79672 Pain in left foot: Secondary | ICD-10-CM | POA: Diagnosis not present

## 2021-09-08 DIAGNOSIS — G8929 Other chronic pain: Secondary | ICD-10-CM | POA: Diagnosis not present

## 2021-09-08 DIAGNOSIS — E782 Mixed hyperlipidemia: Secondary | ICD-10-CM | POA: Diagnosis not present

## 2021-09-08 DIAGNOSIS — I10 Essential (primary) hypertension: Secondary | ICD-10-CM | POA: Diagnosis not present

## 2021-09-08 DIAGNOSIS — E039 Hypothyroidism, unspecified: Secondary | ICD-10-CM | POA: Diagnosis not present

## 2022-03-10 DIAGNOSIS — R7303 Prediabetes: Secondary | ICD-10-CM | POA: Diagnosis not present

## 2022-03-10 DIAGNOSIS — E039 Hypothyroidism, unspecified: Secondary | ICD-10-CM | POA: Diagnosis not present

## 2022-03-10 DIAGNOSIS — I10 Essential (primary) hypertension: Secondary | ICD-10-CM | POA: Diagnosis not present

## 2022-03-10 DIAGNOSIS — G8929 Other chronic pain: Secondary | ICD-10-CM | POA: Diagnosis not present

## 2022-03-10 DIAGNOSIS — E782 Mixed hyperlipidemia: Secondary | ICD-10-CM | POA: Diagnosis not present

## 2022-03-10 DIAGNOSIS — M79672 Pain in left foot: Secondary | ICD-10-CM | POA: Diagnosis not present

## 2022-08-28 DIAGNOSIS — Z9181 History of falling: Secondary | ICD-10-CM | POA: Diagnosis not present

## 2022-08-28 DIAGNOSIS — R7303 Prediabetes: Secondary | ICD-10-CM | POA: Diagnosis not present

## 2022-08-28 DIAGNOSIS — G8929 Other chronic pain: Secondary | ICD-10-CM | POA: Diagnosis not present

## 2022-08-28 DIAGNOSIS — I10 Essential (primary) hypertension: Secondary | ICD-10-CM | POA: Diagnosis not present

## 2022-08-28 DIAGNOSIS — E559 Vitamin D deficiency, unspecified: Secondary | ICD-10-CM | POA: Diagnosis not present

## 2022-08-28 DIAGNOSIS — M79672 Pain in left foot: Secondary | ICD-10-CM | POA: Diagnosis not present

## 2022-08-28 DIAGNOSIS — E039 Hypothyroidism, unspecified: Secondary | ICD-10-CM | POA: Diagnosis not present

## 2022-08-28 DIAGNOSIS — Z139 Encounter for screening, unspecified: Secondary | ICD-10-CM | POA: Diagnosis not present

## 2022-08-28 DIAGNOSIS — E782 Mixed hyperlipidemia: Secondary | ICD-10-CM | POA: Diagnosis not present

## 2023-03-03 DIAGNOSIS — E782 Mixed hyperlipidemia: Secondary | ICD-10-CM | POA: Diagnosis not present

## 2023-03-03 DIAGNOSIS — E559 Vitamin D deficiency, unspecified: Secondary | ICD-10-CM | POA: Diagnosis not present

## 2023-03-03 DIAGNOSIS — R7303 Prediabetes: Secondary | ICD-10-CM | POA: Diagnosis not present

## 2023-03-03 DIAGNOSIS — I10 Essential (primary) hypertension: Secondary | ICD-10-CM | POA: Diagnosis not present

## 2023-03-03 DIAGNOSIS — M79672 Pain in left foot: Secondary | ICD-10-CM | POA: Diagnosis not present

## 2023-03-03 DIAGNOSIS — M79675 Pain in left toe(s): Secondary | ICD-10-CM | POA: Diagnosis not present

## 2023-03-03 DIAGNOSIS — G8929 Other chronic pain: Secondary | ICD-10-CM | POA: Diagnosis not present

## 2023-03-03 DIAGNOSIS — E039 Hypothyroidism, unspecified: Secondary | ICD-10-CM | POA: Diagnosis not present

## 2023-09-02 DIAGNOSIS — Z139 Encounter for screening, unspecified: Secondary | ICD-10-CM | POA: Diagnosis not present

## 2023-09-02 DIAGNOSIS — Z9181 History of falling: Secondary | ICD-10-CM | POA: Diagnosis not present

## 2023-09-02 DIAGNOSIS — E039 Hypothyroidism, unspecified: Secondary | ICD-10-CM | POA: Diagnosis not present

## 2023-09-02 DIAGNOSIS — M79671 Pain in right foot: Secondary | ICD-10-CM | POA: Diagnosis not present

## 2023-09-02 DIAGNOSIS — R7303 Prediabetes: Secondary | ICD-10-CM | POA: Diagnosis not present

## 2023-09-02 DIAGNOSIS — G8929 Other chronic pain: Secondary | ICD-10-CM | POA: Diagnosis not present

## 2023-09-02 DIAGNOSIS — I10 Essential (primary) hypertension: Secondary | ICD-10-CM | POA: Diagnosis not present

## 2023-09-02 DIAGNOSIS — E782 Mixed hyperlipidemia: Secondary | ICD-10-CM | POA: Diagnosis not present

## 2023-09-02 DIAGNOSIS — E559 Vitamin D deficiency, unspecified: Secondary | ICD-10-CM | POA: Diagnosis not present
